# Patient Record
Sex: Male | Born: 1985 | Race: Black or African American | Hispanic: No | Marital: Married | Smoking: Never smoker
Health system: Southern US, Community
[De-identification: ages and names within clinical notes are randomized; demographics above are authoritative.]

---

## 2019-08-30 ENCOUNTER — Ambulatory Visit: Payer: Self-pay | Attending: Internal Medicine

## 2019-08-30 DIAGNOSIS — Z23 Encounter for immunization: Secondary | ICD-10-CM

## 2019-08-30 NOTE — Progress Notes (Signed)
   Covid-19 Vaccination Clinic  Name:  Matthew Hernandez    MRN: [id number] DOB: 04/27/1986  08/30/2019  [name] was observed post Covid-19 immunization for 15 minutes without incident. He was provided with Vaccine Information Sheet and instruction to access the V-Safe system.   [name] was instructed to call 911 with any severe reactions post vaccine: Marland Kitchen Difficulty breathing  . Swelling of face and throat  . A fast heartbeat  . A bad rash all over body  . Dizziness and weakness   Immunizations Administered    Name Date Dose VIS Date Route   Pfizer COVID-19 Vaccine 08/30/2019  1:48 PM 0.3 mL 05/23/2019 Intramuscular   Manufacturer: [employer], [employer]   Lot: [id number]   NDC: [address]-4

## 2019-09-21 ENCOUNTER — Other Ambulatory Visit: Payer: Self-pay

## 2019-09-21 ENCOUNTER — Encounter: Payer: Self-pay | Admitting: Emergency Medicine

## 2019-09-21 ENCOUNTER — Ambulatory Visit (INDEPENDENT_AMBULATORY_CARE_PROVIDER_SITE_OTHER): Payer: 59

## 2019-09-21 ENCOUNTER — Ambulatory Visit
Admission: EM | Admit: 2019-09-21 | Discharge: 2019-09-21 | Disposition: A | Discharge status: Home / Self Care | Payer: 59 | Attending: Emergency Medicine | Admitting: Emergency Medicine

## 2019-09-21 DIAGNOSIS — M25571 Pain in right ankle and joints of right foot: Secondary | ICD-10-CM | POA: Diagnosis not present

## 2019-09-21 DIAGNOSIS — W010XXA Fall on same level from slipping, tripping and stumbling without subsequent striking against object, initial encounter: Secondary | ICD-10-CM | POA: Diagnosis not present

## 2019-09-21 DIAGNOSIS — S93421A Sprain of deltoid ligament of right ankle, initial encounter: Secondary | ICD-10-CM

## 2019-09-21 MED ORDER — IBUPROFEN 600 MG PO TABS: 600.0000 mg | ORAL_TABLET | Freq: Four times a day (QID) | ORAL | 0 refills | Status: DC | PRN

## 2019-09-21 NOTE — ED Provider Notes (Signed)
HPI  SUBJECTIVE:  Matthew Hernandez is a 34 y.o. male who presents with medial right ankle pain.  States that he was walking yesterday, slipped on wet wood rolling his right ankle inward.  He states that he was unable to bear weight on it immediately afterward.  States that he is unable to bear full weight on it today.  He reports dull medial pain with rolling his ankle outward, denies pain in any other time.  He denies bruising swelling numbness or tingling of his foot.  Denies injury to his foot.  He tried 400 mg ibuprofen with improvement of symptoms.  Symptoms are worse with rolling his ankle outward.  He has a past medical history of lateral right ankle sprain.  Did not seek medical attention for this.  No history of diabetes hypertension smoking.  PMD: None.  History reviewed. No pertinent past medical history.  History reviewed. No pertinent surgical history.  Family History  Problem Relation Age of Onset  . Healthy Mother   . Heart disease Father     Social History   Tobacco Use  . Smoking status: Never Smoker  . Smokeless tobacco: Never Used  Substance Use Topics  . Alcohol use: Yes    Comment: rarely  . Drug use: Never    No current facility-administered medications for this encounter.  Current Outpatient Medications:  .  ibuprofen (ADVIL) 600 MG tablet, Take 1 tablet (600 mg total) by mouth every 6 (six) hours as needed., Disp: 30 tablet, Rfl: 0  No Known Allergies   ROS  As noted in HPI.   Physical Exam  BP (!) 153/88 (BP Location: Right Arm)   Pulse 91   Temp 98.3 F (36.8 C) (Oral)   Resp 16   Ht 6\' 4"  (1.93 m)   Wt 111.1 kg   SpO2 100%   BMI 29.82 kg/m   Constitutional: Well developed, well nourished, no acute distress Eyes:  EOMI, conjunctiva normal bilaterally HENT: Normocephalic, atraumatic,mucus membranes moist Respiratory: Normal inspiratory effort Cardiovascular: Normal rate GI: nondistended skin: No rash, skin intact Musculoskeletal:  R  ankle : Proximal fibula NT , Distal fibula NT, Medial malleolus NT,  Deltoid ligament medially  tender,  Lateral ligaments NT , ATFL laterally NT, calcaneofibular ligament laterally NT, posterior tablofibular ligament NT ,  Achilles NT, calcaneus  NT,  Proximal 5th metatarsal NT, Midfoot NT, distal NVI with baseline sensation / motor to foot intact. no pain with dorsiflexion/plantar flexion.  Pain with inversion , no pain with eversion.  - bruising.  - squeeze test.  Ant drawer test painful but stable. Pt  not able to bear weight in dept.  Neurologic: Alert & oriented x 3, no focal neuro deficits Psychiatric: Speech and behavior appropriate   ED Course   Medications - No data to display  Orders Placed This Encounter  Procedures  . DG Ankle Complete Right    Standing Status:   Standing    Number of Occurrences:   1    Order Specific Question:   Reason for Exam (SYMPTOM  OR DIAGNOSIS REQUIRED)    Answer:   fell yesterday c/o right ankle pain  . Apply ASO ankle    Standing Status:   Standing    Number of Occurrences:   1    Order Specific Question:   Laterality    Answer:   Right  . Crutches    Standing Status:   Standing    Number of Occurrences:   1  No results found for this or any previous visit (from the past 24 hour(s)). DG Ankle Complete Right  Result Date: 09/21/2019 CLINICAL DATA:  Pain after fall. EXAM: RIGHT ANKLE - COMPLETE 3+ VIEW COMPARISON:  None. FINDINGS: There is no evidence of fracture, dislocation, or joint effusion. There is no evidence of arthropathy or other focal bone abnormality. Soft tissues are unremarkable. IMPRESSION: Negative. Electronically Signed   By: Gerome Sam III M.D   On: 09/21/2019 11:45    ED Clinical Impression  1. Sprain of deltoid ligament of right ankle, initial encounter      ED Assessment/Plan   Reviewed imaging independently.  Normal ankle.  See radiology report for full details.  Patient with a medial ankle sprain.  Home  with ASO, crutches.  Ibuprofen/Tylenol as needed.  Follow-up with [facility] in a week for reevaluation and physical therapy.  Discussed  imaging, MDM, treatment plan, and plan for follow-up with patient. Discussed sn/sx that should prompt return to the ED. patient agrees with plan.   Meds ordered this encounter  Medications  . ibuprofen (ADVIL) 600 MG tablet    Sig: Take 1 tablet (600 mg total) by mouth every 6 (six) hours as needed.    Dispense:  30 tablet    Refill:  0    *This clinic note was created using Scientist, clinical (histocompatibility and immunogenetics). Therefore, there may be occasional mistakes despite careful proofreading.   ?    Domenick Gong, MD 09/21/19 1743

## 2019-09-21 NOTE — Discharge Instructions (Addendum)
600 mg of ibuprofen combined with 1000 mg of Tylenol 3-4 times a day as needed for pain.  Ice your ankle for 20 minutes at a time, elevate above your heart is much as possible.  Wear the ASO for comfort.  Crutches as needed for comfort.  Follow-up with [facility] for reevaluation and physical therapy in a week.

## 2019-09-21 NOTE — ED Triage Notes (Signed)
Patient states that he slipped on his steps outside last night.  Patient states that he rolled his right ankle.  Patient c/o right ankle pain and is unable to bear weight on it.

## 2019-09-24 ENCOUNTER — Ambulatory Visit: Payer: 59 | Attending: Internal Medicine

## 2019-09-24 DIAGNOSIS — Z23 Encounter for immunization: Secondary | ICD-10-CM

## 2019-09-24 NOTE — Progress Notes (Signed)
   Covid-19 Vaccination Clinic  Name:  Matthew Hernandez    MRN: [id number] DOB: Apr 22, 1986  09/24/2019  Mr. Matthew Hernandez was observed post Covid-19 immunization for 15 minutes without incident. He was provided with Vaccine Information Sheet and instruction to access the V-Safe system.   Mr. Matthew Hernandez was instructed to call 911 with any severe reactions post vaccine: Marland Kitchen Difficulty breathing  . Swelling of face and throat  . A fast heartbeat  . A bad rash all over body  . Dizziness and weakness   Immunizations Administered    Name Date Dose VIS Date Route   Pfizer COVID-19 Vaccine 09/24/2019 10:52 AM 0.3 mL 05/23/2019 Intramuscular   Manufacturer: [employer], [employer]   Lot: [id number]   NDC: [address]-7

## 2021-12-08 IMAGING — CR DG ANKLE COMPLETE 3+V*R*
3 series · 3 of 3 positions shown · non-contrast
Comparison: None.

CLINICAL DATA: Pain after fall.

EXAM:
RIGHT ANKLE - COMPLETE 3+ VIEW

[ankle obl]
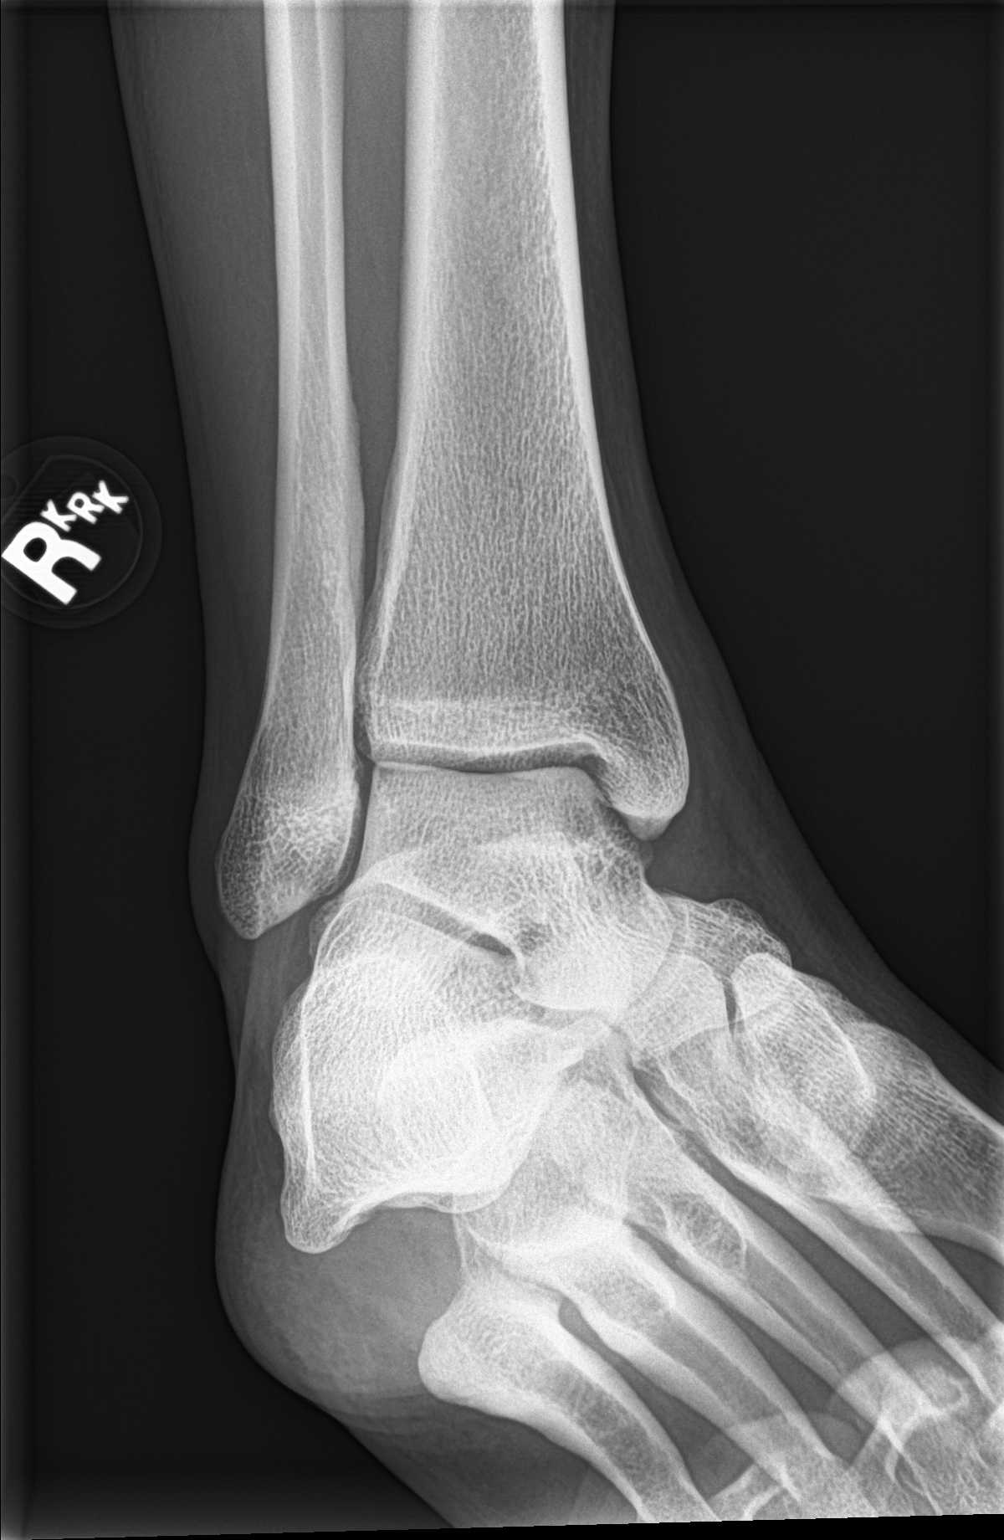

[ankle lat]
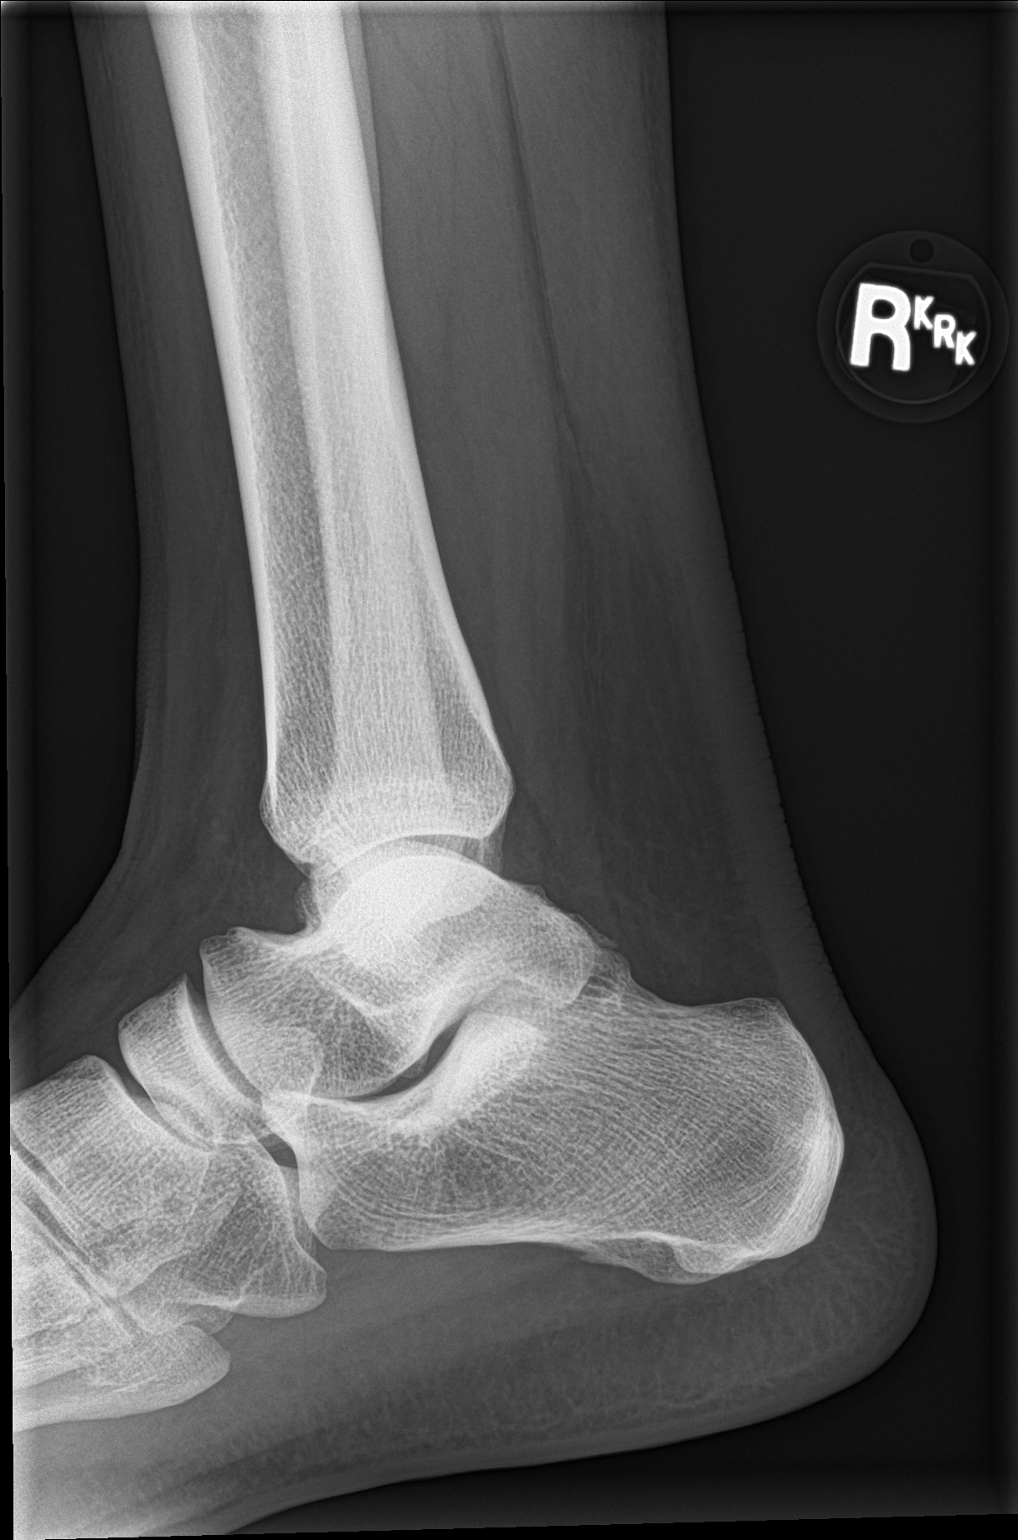

[ankle ap]
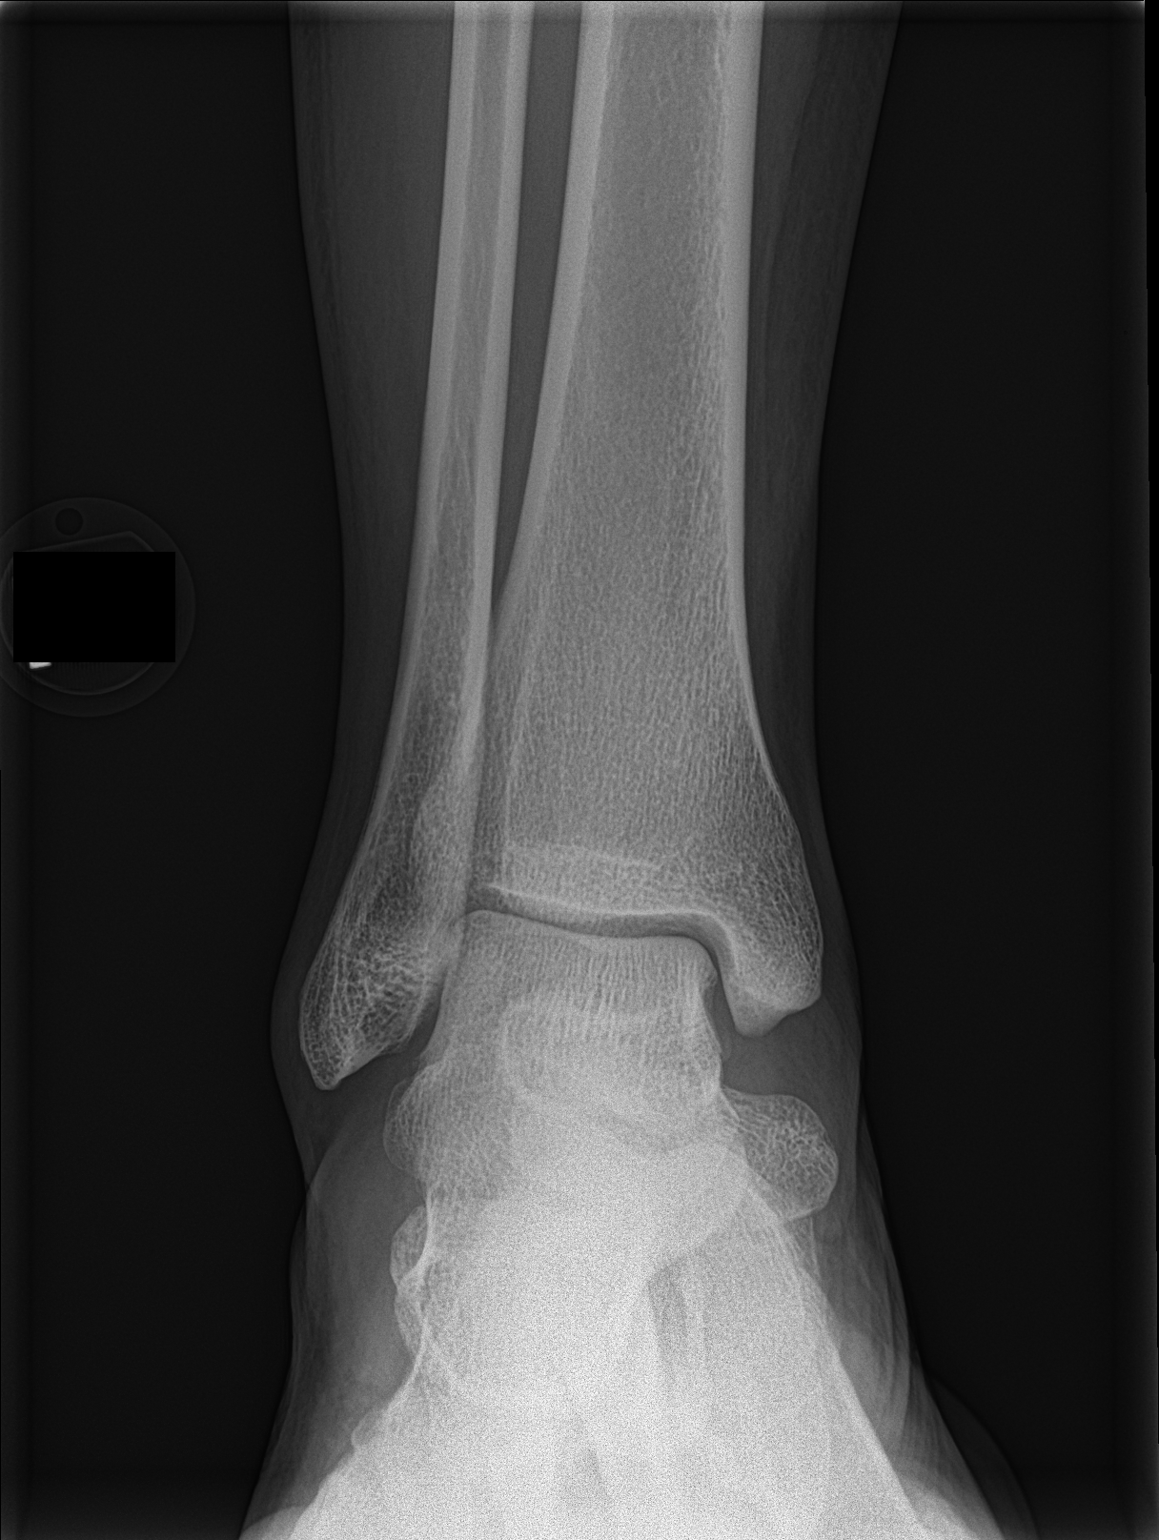

[3 of 3 positions shown; findings below may reference images not displayed]

FINDINGS: There is no evidence of fracture, dislocation, or joint effusion.
There is no evidence of arthropathy or other focal bone abnormality.
Soft tissues are unremarkable.
IMPRESSION: Negative.

## 2022-01-13 ENCOUNTER — Ambulatory Visit (INDEPENDENT_AMBULATORY_CARE_PROVIDER_SITE_OTHER): Payer: 59 | Admitting: Nurse Practitioner

## 2022-01-13 ENCOUNTER — Encounter: Payer: Self-pay | Admitting: Nurse Practitioner

## 2022-01-13 VITALS — BP 124/71 | HR 80 | Temp 97.3°F | Ht 76.0 in | Wt 277.0 lb

## 2022-01-13 DIAGNOSIS — R7301 Impaired fasting glucose: Secondary | ICD-10-CM

## 2022-01-13 DIAGNOSIS — Z7689 Persons encountering health services in other specified circumstances: Secondary | ICD-10-CM

## 2022-01-13 DIAGNOSIS — Z6833 Body mass index (BMI) 33.0-33.9, adult: Secondary | ICD-10-CM | POA: Diagnosis not present

## 2022-01-13 DIAGNOSIS — Z0000 Encounter for general adult medical examination without abnormal findings: Secondary | ICD-10-CM

## 2022-01-13 NOTE — Progress Notes (Signed)
New Patient Office Visit  Subjective    Patient ID: Matthew Hernandez, male    DOB: 1985-08-01  Age: 36 y.o. MRN: [id number]  CC:  Chief Complaint  Patient presents with   Establish Care    HPI Matthew Hernandez presents to establish care He has not had previous primary care provider.  Due for physical and routine, fasting labs.  No current concerns or complaints today   Outpatient Encounter Medications as of 01/13/2022  Medication Sig   ibuprofen (ADVIL) 600 MG tablet Take 1 tablet (600 mg total) by mouth every 6 (six) hours as needed. (Patient not taking: Reported on 01/13/2022)   No facility-administered encounter medications on file as of 01/13/2022.    No past medical history on file.  No past surgical history on file.  Family History  Problem Relation Age of Onset   Healthy Mother    Heart disease Father     Social History   Socioeconomic History   Marital status: Married    Spouse name: Not on file   Number of children: Not on file   Years of education: Not on file   Highest education level: Not on file  Occupational History   Not on file  Tobacco Use   Smoking status: Never   Smokeless tobacco: Never  Vaping Use   Vaping Use: Never used  Substance and Sexual Activity   Alcohol use: Yes    Comment: rarely   Drug use: Never   Sexual activity: Not on file  Other Topics Concern   Not on file  Social History Narrative   Not on file   Social Determinants of Health   Financial Resource Strain: Not on file  Food Insecurity: Not on file  Transportation Needs: Not on file  Physical Activity: Not on file  Stress: Not on file  Social Connections: Not on file  Intimate Partner Violence: Not on file    Review of Systems  Constitutional:  Negative for chills, fever and malaise/fatigue.  HENT:  Negative for congestion, sinus pain and sore throat.   Eyes: Negative.   Respiratory:  Negative for cough, shortness of breath and wheezing.   Cardiovascular:  Negative  for chest pain, palpitations and leg swelling.  Gastrointestinal:  Negative for constipation, diarrhea, nausea and vomiting.  Genitourinary: Negative.   Musculoskeletal:  Negative for myalgias.  Skin: Negative.   Neurological:  Negative for dizziness and headaches.  Endo/Heme/Allergies:  Does not bruise/bleed easily.  Psychiatric/Behavioral:  Negative for depression. The patient is not nervous/anxious.         Objective    Today's Vitals   01/13/22 1057  BP: 124/71  Pulse: 80  Temp: (!) 97.3 F (36.3 C)  TempSrc: Temporal  Weight: 277 lb (125.6 kg)  Height: 6\' 4"  (1.93 m)   Body mass index is 33.72 kg/m.   Physical Exam Vitals and nursing note reviewed.  Constitutional:      Appearance: Normal appearance. He is well-developed.  HENT:     Head: Normocephalic and atraumatic.     Nose: Nose normal.     Mouth/Throat:     Mouth: Mucous membranes are moist.     Pharynx: Oropharynx is clear.  Eyes:     Extraocular Movements: Extraocular movements intact.     Conjunctiva/sclera: Conjunctivae normal.     Pupils: Pupils are equal, round, and reactive to light.  Cardiovascular:     Rate and Rhythm: Normal rate and regular rhythm.     Pulses: Normal pulses.  Heart sounds: Normal heart sounds.  Pulmonary:     Effort: Pulmonary effort is normal.     Breath sounds: Normal breath sounds.  Abdominal:     Palpations: Abdomen is soft.  Musculoskeletal:        General: Normal range of motion.     Cervical back: Normal range of motion and neck supple.  Lymphadenopathy:     Cervical: No cervical adenopathy.  Skin:    General: Skin is warm and dry.     Capillary Refill: Capillary refill takes less than 2 seconds.  Neurological:     General: No focal deficit present.     Mental Status: He is alert and oriented to person, place, and time.  Psychiatric:        Mood and Affect: Mood normal.        Behavior: Behavior normal.        Thought Content: Thought content normal.         Judgment: Judgment normal.        Assessment & Plan:  1. BMI 33.0-33.9,adult Encourage patient to limit calorie intake to 2000 cal/day or less.  He should consume a low cholesterol, low-fat diet. Recommend he incorporate  exercise into  his daily routine.   2. Encounter to establish care Appointment today to establish new primary care provider     Problem List Items Addressed This Visit       Other   BMI 33.0-33.9,adult - Primary   Other Visit Diagnoses     Encounter to establish care           Return in about 2 weeks (around 01/27/2022) for health maintenance exam, FBW at time of visit.   Carlean Jews, NP

## 2022-01-22 DIAGNOSIS — Z6833 Body mass index (BMI) 33.0-33.9, adult: Secondary | ICD-10-CM | POA: Insufficient documentation

## 2022-02-16 NOTE — Progress Notes (Deleted)
Complete physical exam   Patient: Matthew Hernandez   DOB: 1985/11/02   36 y.o. Male  MRN: [id number] Visit Date: 02/17/2022    No chief complaint on file.  Subjective    Matthew Hernandez is a 36 y.o. male who presents today for a complete physical exam.  He reports consuming a {diet types:17450} diet. {Exercise:19826} He generally feels {well/fairly well/poorly:18703}. He {does/does not:200015} have additional problems to discuss today.   HPI  Annual physical  -due for routine fasting labs. Orders already placed.  -no medical or surgical history   No past medical history on file. No past surgical history on file. Social History   Socioeconomic History   Marital status: Married    Spouse name: Not on file   Number of children: Not on file   Years of education: Not on file   Highest education level: Not on file  Occupational History   Not on file  Tobacco Use   Smoking status: Never   Smokeless tobacco: Never  Vaping Use   Vaping Use: Never used  Substance and Sexual Activity   Alcohol use: Yes    Comment: rarely   Drug use: Never   Sexual activity: Not on file  Other Topics Concern   Not on file  Social History Narrative   Not on file   Social Determinants of Health   Financial Resource Strain: Not on file  Food Insecurity: Not on file  Transportation Needs: Not on file  Physical Activity: Not on file  Stress: Not on file  Social Connections: Not on file  Intimate Partner Violence: Not on file   Family Status  Relation Name Status   Mother  Alive   Father  Alive   Family History  Problem Relation Age of Onset   Healthy Mother    Heart disease Father    Allergies  Allergen Reactions   Lactose     Other reaction(s): Other (See Comments) Lactose intolerance per patient    Patient Care Team: Patient, No Pcp Per as PCP - General (General Practice)   Medications: No outpatient medications prior to visit.   No facility-administered medications prior to  visit.    Review of Systems  {Labs (Optional):23779}   Objective    There were no vitals taken for this visit. BP Readings from Last 3 Encounters:  01/13/22 124/71  09/21/19 (!) 153/88    Wt Readings from Last 3 Encounters:  01/13/22 277 lb (125.6 kg)  09/21/19 245 lb (111.1 kg)     Physical Exam  ***  Last depression screening scores    01/13/2022   11:04 AM  PHQ 2/9 Scores  PHQ - 2 Score 0  PHQ- 9 Score 0   Last fall risk screening    01/13/2022   11:04 AM  Fall Risk   Falls in the past year? 0  Number falls in past yr: 0  Injury with Fall? 0  Risk for fall due to : No Fall Risks  Follow up Falls evaluation completed   Last Audit-C alcohol use screening     No data to display         A score of 3 or more in women, and 4 or more in men indicates increased risk for alcohol abuse, EXCEPT if all of the points are from question 1   No results found for any visits on 02/17/22.  Assessment & Plan    Routine Health Maintenance and Physical Exam  Exercise Activities and Dietary recommendations  Goals   None     Immunization History  Administered Date(s) Administered   PFIZER Comirnaty(Gray Top)Covid-19 Tri-Sucrose Vaccine 08/30/2019, 09/24/2019   PFIZER(Purple Top)SARS-COV-2 Vaccination 08/30/2019, 09/24/2019    Health Maintenance  Topic Date Due   HIV Screening  Never done   Hepatitis C Screening  Never done   COVID-19 Vaccine (5 - Pfizer series) 11/19/2019   INFLUENZA VACCINE  Never done   TETANUS/TDAP  03/16/2030   HPV VACCINES  Aged Out    Discussed health benefits of physical activity, and encouraged him to engage in regular exercise appropriate for his age and condition.  Problem List Items Addressed This Visit   None    No follow-ups on file.        Carlean Jews, NP  [facility] [phone]) [phone] (fax)  [facility]

## 2022-02-17 ENCOUNTER — Encounter: Payer: 59 | Admitting: Nurse Practitioner
